# Patient Record
Sex: Male | Born: 2000 | Race: Black or African American | Hispanic: No | Marital: Single | Smoking: Never smoker
Health system: Southern US, Community
[De-identification: ages and names within clinical notes are randomized; demographics above are authoritative.]

## PROBLEM LIST (undated history)

## (undated) HISTORY — PX: INGUINAL HERNIA REPAIR: SHX194

---
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
## 2021-02-09 ENCOUNTER — Telehealth: Payer: Self-pay

## 2021-02-09 NOTE — Telephone Encounter (Signed)
Referral notes received from [facility], Phone #: [phone], Fax #: [phone]   A copy of the notes have been placed in the scheduling box for check-out to pick-up and to enter referral. Original notes placed in file cabinet.

## 2021-03-26 ENCOUNTER — Other Ambulatory Visit: Payer: Self-pay

## 2021-03-26 ENCOUNTER — Encounter: Payer: Self-pay | Admitting: Cardiology

## 2021-03-26 ENCOUNTER — Ambulatory Visit: Payer: BC Managed Care – PPO | Admitting: Cardiology

## 2021-03-26 VITALS — BP 130/80 | HR 79 | Ht 71.0 in | Wt 188.0 lb

## 2021-03-26 DIAGNOSIS — R079 Chest pain, unspecified: Secondary | ICD-10-CM | POA: Diagnosis not present

## 2021-03-26 NOTE — Patient Instructions (Signed)

## 2021-03-26 NOTE — Progress Notes (Signed)
Cardiology Office Note:    Date:  03/26/2021   ID:  Jerry Valenzuela, DOB 08/12/2000, MRN [id number]  PCP:  Santa Genera, MD   [facility] Providers Cardiologist:  None     Referring MD: Juliet Rude*   Chief Complaint  Patient presents with   New Patient (Initial Visit)    Referred by PCP for chest pain -- Patient states he took a break from working out and when he started back he felt this chest pain. The chest pain has resolved. Meds reviewed verbally with patient.    Jerry Valenzuela is a 20 y.o. male who is being seen today for the evaluation of chest pain at the request of Dorma Russell, Katheren Shams, P*.   History of Present Illness:    Jerry Valenzuela is a 20 y.o. male with no significant past medical history who presents due to back and chest pain.  Patient states having back discomfort last month, also had an episode of chest pain.  Went to an urgent care, x-ray was obtained, he is unsure of the results, was advised to follow-up with cardiology.  He works out daily at [employer], [employer], movements, without any symptoms of chest pain or shortness of breath.  Denies smoking, denies any cardiac risk factors or family history of heart disease.  He feels well, states chest discomfort have completely resolved.  Has no concerns at this time.  History reviewed. No pertinent past medical history.  Past Surgical History:  Procedure Laterality Date   INGUINAL HERNIA REPAIR      Current Medications: No outpatient medications have been marked as taking for the 03/26/21 encounter (Office Visit) with Debbe Odea, MD.     Allergies:   Patient has no known allergies.   Social History   Socioeconomic History   Marital status: Single    Spouse name: Not on file   Number of children: Not on file   Years of education: Not on file   Highest education level: Not on file  Occupational History   Not on file  Tobacco Use   Smoking status: Never   Smokeless tobacco: Never   Substance and Sexual Activity   Alcohol use: Never   Drug use: Never   Sexual activity: Not on file  Other Topics Concern   Not on file  Social History Narrative   Not on file   Social Determinants of Health   Financial Resource Strain: Not on file  Food Insecurity: Not on file  Transportation Needs: Not on file  Physical Activity: Not on file  Stress: Not on file  Social Connections: Not on file     Family History: The patient's family history is not on file.  ROS:   Please see the history of present illness.     All other systems reviewed and are negative.  EKGs/Labs/Other Studies Reviewed:    The following studies were reviewed today:   EKG:  EKG is  ordered today.  The ekg ordered today demonstrates normal sinus rhythm, normal ECG.  Recent Labs: No results found for requested labs within last 8760 hours.  Recent Lipid Panel No results found for: CHOL, TRIG, HDL, CHOLHDL, VLDL, LDLCALC, LDLDIRECT   Risk Assessment/Calculations:          Physical Exam:    VS:  BP 130/80 (BP Location: Left Arm, Patient Position: Sitting, Cuff Size: Normal)   Pulse 79   Ht 5\' 11"  (1.803 m)   Wt 188 lb (85.3 kg)  SpO2 98%   BMI 26.22 kg/m     Wt Readings from Last 3 Encounters:  03/26/21 188 lb (85.3 kg) (86 %, Z= 1.10)*  02/12/15 120 lb 8 oz (54.7 kg) (70 %, Z= 0.53)*   * Growth percentiles are based on CDC (Boys, 2-20 Years) data.     GEN:  Well nourished, well developed in no acute distress HEENT: Normal NECK: No JVD; No carotid bruits LYMPHATICS: No lymphadenopathy CARDIAC: RRR, no murmurs, rubs, gallops RESPIRATORY:  Clear to auscultation without rales, wheezing or rhonchi  ABDOMEN: Soft, non-tender, non-distended MUSCULOSKELETAL:  No edema; No deformity  SKIN: Warm and dry NEUROLOGIC:  Alert and oriented x 3 PSYCHIATRIC:  Normal affect   ASSESSMENT:    1. Chest pain, unspecified type    PLAN:    In order of problems listed above:  Patient with  an episode of chest discomfort not consistent with cardiac etiology.  He has no cardiac risk factors, he is young, discomfort is completely resolved.  Works out/exercises daily without any symptoms of chest pain or shortness of breath.  Patient made aware of benign nature, chest pain likely musculoskeletal in origin.  Patient educated on cardiac causes of chest pain.  He was reassured.  Follow-up as needed     Medication Adjustments/Labs and Tests Ordered: Current medicines are reviewed at length with the patient today.  Concerns regarding medicines are outlined above.  Orders Placed This Encounter  Procedures   EKG 12-Lead    No orders of the defined types were placed in this encounter.   Patient Instructions  Medication Instructions:  Your physician recommends that you continue on your current medications as directed. Please refer to the Current Medication list given to you today.  *If you need a refill on your cardiac medications before your next appointment, please call your pharmacy*   Lab Work: None ordered If you have labs (blood work) drawn today and your tests are completely normal, you will receive your results only by: MyChart Message (if you have MyChart) OR A paper copy in the mail If you have any lab test that is abnormal or we need to change your treatment, we will call you to review the results.   Testing/Procedures: None ordered   Follow-Up: At [facility], you and your health needs are our priority.  As part of our continuing mission to provide you with exceptional heart care, we have created designated Provider Care Teams.  These Care Teams include your primary Cardiologist (physician) and Advanced Practice Providers (APPs -  Physician Assistants and Nurse Practitioners) who all work together to provide you with the care you need, when you need it.  We recommend signing up for the patient portal called "MyChart".  Sign up information is provided on this  After Visit Summary.  MyChart is used to connect with patients for Virtual Visits (Telemedicine).  Patients are able to view lab/test results, encounter notes, upcoming appointments, etc.  Non-urgent messages can be sent to your provider as well.   To learn more about what you can do with MyChart, go to [email or web address].    Your next appointment:   Follow up as needed   The format for your next appointment:   In Person  Provider:   Debbe Odea, MD   Other Instructions    Signed, Debbe Odea, MD  03/26/2021 1:05 PM    [facility]

## 2021-04-09 ENCOUNTER — Telehealth: Payer: Self-pay

## 2021-04-09 NOTE — Telephone Encounter (Signed)
NOTES SCANNED TO REFERRAL 

## 2023-01-02 ENCOUNTER — Other Ambulatory Visit: Payer: Self-pay

## 2023-01-02 ENCOUNTER — Encounter (HOSPITAL_COMMUNITY): Payer: Self-pay | Admitting: *Deleted

## 2023-01-02 ENCOUNTER — Ambulatory Visit (HOSPITAL_COMMUNITY)
Admission: EM | Admit: 2023-01-02 | Discharge: 2023-01-02 | Disposition: A | Discharge status: Home / Self Care | Payer: BC Managed Care – PPO | Attending: Emergency Medicine | Admitting: Emergency Medicine

## 2023-01-02 DIAGNOSIS — R059 Cough, unspecified: Secondary | ICD-10-CM | POA: Diagnosis not present

## 2023-01-02 DIAGNOSIS — J069 Acute upper respiratory infection, unspecified: Secondary | ICD-10-CM | POA: Diagnosis not present

## 2023-01-02 DIAGNOSIS — U071 COVID-19: Secondary | ICD-10-CM | POA: Diagnosis not present

## 2023-01-02 NOTE — ED Provider Notes (Signed)
[facility]    CSN: [id number] Arrival date & time: 01/02/23  1015      History   Chief Complaint Chief Complaint  Patient presents with   Fever   Chills    HPI Jerry Valenzuela is a 22 y.o. male.   Patient presents to clinic over concerns of sore throat, fever, chills and feeling unwell since Friday.  His symptoms have improved today.  He did take Aleve for his symptoms, has not taken anything today.  His temperature at home yesterday was 100, this morning he checked it and it was 98.  Denies any recent sick contacts.  Denies chest pain, shortness of breath, wheezing, nausea, vomiting or diarrhea.    The history is provided by the patient and medical records.  Fever Associated symptoms: cough and sore throat   Associated symptoms: no chest pain, no diarrhea, no nausea and no vomiting     History reviewed. No pertinent past medical history.  There are no problems to display for this patient.   Past Surgical History:  Procedure Laterality Date   INGUINAL HERNIA REPAIR         Home Medications    Prior to Admission medications   Not on File    Family History History reviewed. No pertinent family history.  Social History Social History   Tobacco Use   Smoking status: Never   Smokeless tobacco: Never  Substance Use Topics   Alcohol use: Never   Drug use: Never     Allergies   Patient has no known allergies.   Review of Systems Review of Systems  Constitutional:  Positive for fever.  HENT:  Positive for sore throat.   Respiratory:  Positive for cough. Negative for shortness of breath and wheezing.   Cardiovascular:  Negative for chest pain.  Gastrointestinal:  Negative for abdominal pain, diarrhea, nausea and vomiting.     Physical Exam Triage Vital Signs ED Triage Vitals  Enc Vitals Group     BP 01/02/23 1059 127/82     Pulse Rate 01/02/23 1059 98     Resp 01/02/23 1059 18     Temp 01/02/23 1059 99.8 F (37.7 C)     Temp src --       SpO2 01/02/23 1059 95 %     Weight --      Height --      Head Circumference --      Peak Flow --      Pain Score 01/02/23 1055 0     Pain Loc --      Pain Edu? --      Excl. in GC? --    No data found.  Updated Vital Signs BP 127/82   Pulse 98   Temp 99.8 F (37.7 C)   Resp 18   SpO2 95%   Visual Acuity Right Eye Distance:   Left Eye Distance:   Bilateral Distance:    Right Eye Near:   Left Eye Near:    Bilateral Near:     Physical Exam Vitals and nursing note reviewed.  Constitutional:      Appearance: Normal appearance.  HENT:     Head: Normocephalic and atraumatic.     Right Ear: External ear normal.     Left Ear: External ear normal.     Nose: Nose normal.     Mouth/Throat:     Mouth: Mucous membranes are moist.     Pharynx: Posterior oropharyngeal erythema present.  Eyes:  Conjunctiva/sclera: Conjunctivae normal.  Cardiovascular:     Rate and Rhythm: Normal rate and regular rhythm.     Heart sounds: Normal heart sounds. No murmur heard. Pulmonary:     Effort: Pulmonary effort is normal. No respiratory distress.     Breath sounds: Normal breath sounds.  Musculoskeletal:        General: Normal range of motion.  Lymphadenopathy:     Cervical: Cervical adenopathy present.  Skin:    General: Skin is warm and dry.  Neurological:     General: No focal deficit present.     Mental Status: He is alert and oriented to person, place, and time.  Psychiatric:        Mood and Affect: Mood normal.      UC Treatments / Results  Labs (all labs ordered are listed, but only abnormal results are displayed) Labs Reviewed  SARS CORONAVIRUS 2 (TAT 6-24 HRS)    EKG   Radiology No results found.  Procedures Procedures (including critical care time)  Medications Ordered in UC Medications - No data to display  Initial Impression / Assessment and Plan / UC Course  I have reviewed the triage vital signs and the nursing notes.  Pertinent labs &  imaging results that were available during my care of the patient were reviewed by me and considered in my medical decision making (see chart for details).  Vitals and triage reviewed, patient is hemodynamically stable.  Posterior pharynx with erythema, lungs vesicular posteriorly.  Uvula midline, tonsils without edema or exudate, low concern for strep, PTA or other bacterial pathogens. Patient symptoms consistent with a viral URI.  Symptomatic management discussed.  Offered COVID-19 testing, patient accepted.  Staff to contact if positive.  Plan of care, follow-up care and return precautions given, no questions at this time.     Final Clinical Impressions(s) / UC Diagnoses   Final diagnoses:  Viral URI with cough     Discharge Instructions      Your systems are consistent with a viral illness.  We have swabbed you for COVID-19 and we will contact you if positive.  Please continue symptomatic management, you can alternate between 800 mg of ibuprofen and 500 mg of Tylenol every 4-6 hours for body aches, fever and discomfort.  For your sore throat you can sleep with a humidifier, do warm saline gargles, drink tea with honey.  Over-the-counter throat lozenges can help as well.  Ensure you are staying hydrated with at least 64 ounces of water daily.  Please return to clinic or follow-up with your primary care provider if your symptoms do not improve over the next 5 to 7 days or if you develop any new or urgent symptoms.      ED Prescriptions   None    PDMP not reviewed this encounter.   Dajia Gunnels, [place], [place] 01/02/23 1116

## 2023-01-02 NOTE — Discharge Instructions (Addendum)
Your systems are consistent with a viral illness.  We have swabbed you for COVID-19 and we will contact you if positive.  Please continue symptomatic management, you can alternate between 800 mg of ibuprofen and 500 mg of Tylenol every 4-6 hours for body aches, fever and discomfort.  For your sore throat you can sleep with a humidifier, do warm saline gargles, drink tea with honey.  Over-the-counter throat lozenges can help as well.  Ensure you are staying hydrated with at least 64 ounces of water daily.  Please return to clinic or follow-up with your primary care provider if your symptoms do not improve over the next 5 to 7 days or if you develop any new or urgent symptoms.

## 2023-01-02 NOTE — ED Triage Notes (Signed)
Pt reports since Friday he has had a sore throat and fever with chills. Pt reports sx's have improved today.

## 2023-01-03 LAB — SARS CORONAVIRUS 2 (TAT 6-24 HRS): SARS Coronavirus 2: POSITIVE — AB

## 2024-03-09 ENCOUNTER — Other Ambulatory Visit (INDEPENDENT_AMBULATORY_CARE_PROVIDER_SITE_OTHER): Payer: Self-pay

## 2024-03-09 ENCOUNTER — Encounter: Payer: Self-pay | Admitting: Surgical

## 2024-03-09 ENCOUNTER — Ambulatory Visit: Payer: Self-pay | Admitting: Surgical

## 2024-03-09 DIAGNOSIS — M79644 Pain in right finger(s): Secondary | ICD-10-CM | POA: Diagnosis not present

## 2024-03-09 NOTE — Progress Notes (Signed)
 Office Visit Note   Patient: Jerry Valenzuela           Date of Birth: 01-24-2001           MRN: [id number] Visit Date: 03/09/2024 Requested by: Geroge Hubert Norris, PA [address] [place],  [place] [address] PCP: Charmayne Jaegers, MD  Subjective: Chief Complaint  Patient presents with   Right Ring Finger - Injury    HPI: Jerry Valenzuela is a 23 y.o. male who presents to the office reporting right ring finger pain.  Slammed his finger in a car door on 02/27/2024.  He was seen in a walk-in clinic at [facility] on 02/28/2024 and had radiographs demonstrating no fracture.  He was treated for potential open fracture with antibiotics and finger splint placed.  Since then, he has been staying in the finger splint and has returned to work where he works at a [employer].  He has really no significant pain and denies any fevers or chills.  No mechanical symptoms.  No difficulty straightening his finger.  Using splint at all times..                ROS: All systems reviewed are negative as they relate to the chief complaint within the history of present illness.  Patient denies fevers or chills.  Assessment & Plan: Visit Diagnoses:  1. Finger pain, right     Plan: Impression is 23 year old male with right ring finger pain and swelling initially with initial radiographs negative for fracture and today's radiographs again negative for fracture.  He has really no tenderness on exam today.  No evidence or concern for mallet finger.  Plan at this time is discontinue splint to avoid significant stiffness of the finger.  Okay to return to work in full capacity which he is already doing without difficulty.  Okay to follow-up with the office as needed and return precautions were discussed including difficulty extending the DIP joint or increased pain.  He understands he should follow-up with the office and return to the finger splint if he has any difficulty with DIP extension.  Follow-Up Instructions:  Return if symptoms worsen or fail to improve.   Orders:  Orders Placed This Encounter  Procedures   XR Finger Ring Right   No orders of the defined types were placed in this encounter.     Procedures: No procedures performed   Clinical Data: No additional findings.  Objective: Vital Signs: There were no vitals taken for this visit.  Physical Exam:  Constitutional: Patient appears well-developed HEENT:  Head: Normocephalic Eyes:EOM are normal Neck: Normal range of motion Cardiovascular: Normal rate Pulmonary/chest: Effort normal Neurologic: Patient is alert Skin: Skin is warm Psychiatric: Patient has normal mood and affect  Ortho Exam: Ortho exam demonstrates right hand with intact EPL, FPL, finger abduction.  Full flexion and extension of the right ring finger is intact compared with the contralateral extremity.  There is slight swelling just proximal to the base of the nail and there is small subungual hematoma that is resolving toward the distal tip of the nail.  He is able to extend the DIP joint of the right ring finger out against gravity and against resistance with equal strength compared with the contralateral side.  There is no tenderness throughout the distal phalanx or DIP joint or throughout the right ring finger in general.  Specialty Comments:  No specialty comments available.  Imaging: XR Finger Ring Right Result Date: 03/09/2024 PA, oblique, lateral views of  right ring finger reviewed.  No fracture or dislocation.  No significant degenerative changes.    PMFS History: There are no active problems to display for this patient.  History reviewed. No pertinent past medical history.  History reviewed. No pertinent family history.  Past Surgical History:  Procedure Laterality Date   INGUINAL HERNIA REPAIR     Social History   Occupational History   Not on file  Tobacco Use   Smoking status: Never   Smokeless tobacco: Never  Substance and Sexual  Activity   Alcohol use: Never   Drug use: Never   Sexual activity: Not on file

## 2024-04-30 ENCOUNTER — Encounter: Payer: Self-pay | Admitting: Radiology

## 2024-05-29 ENCOUNTER — Ambulatory Visit

## 2024-05-29 ENCOUNTER — Encounter: Payer: Self-pay | Admitting: Podiatry

## 2024-05-29 ENCOUNTER — Ambulatory Visit: Admitting: Podiatry

## 2024-05-29 DIAGNOSIS — S90122A Contusion of left lesser toe(s) without damage to nail, initial encounter: Secondary | ICD-10-CM

## 2024-05-29 DIAGNOSIS — M205X2 Other deformities of toe(s) (acquired), left foot: Secondary | ICD-10-CM | POA: Diagnosis not present

## 2024-05-29 DIAGNOSIS — L603 Nail dystrophy: Secondary | ICD-10-CM | POA: Diagnosis not present

## 2024-05-29 DIAGNOSIS — L84 Corns and callosities: Secondary | ICD-10-CM

## 2024-05-29 MED ORDER — MUPIROCIN 2 % EX OINT: 1.0000 | TOPICAL_OINTMENT | Freq: Two times a day (BID) | CUTANEOUS | 2 refills | Status: AC

## 2024-05-29 NOTE — Patient Instructions (Signed)

## 2024-05-29 NOTE — Progress Notes (Unsigned)
 SABRA

## 2024-07-17 ENCOUNTER — Ambulatory Visit: Payer: Self-pay | Admitting: Podiatry

## 2024-07-17 ENCOUNTER — Encounter: Payer: Self-pay | Admitting: Podiatry

## 2024-07-17 DIAGNOSIS — L989 Disorder of the skin and subcutaneous tissue, unspecified: Secondary | ICD-10-CM | POA: Diagnosis not present

## 2024-07-17 DIAGNOSIS — L603 Nail dystrophy: Secondary | ICD-10-CM

## 2024-07-17 DIAGNOSIS — M205X2 Other deformities of toe(s) (acquired), left foot: Secondary | ICD-10-CM

## 2024-07-17 MED ORDER — CEPHALEXIN 500 MG PO CAPS: 500.0000 mg | ORAL_CAPSULE | Freq: Three times a day (TID) | ORAL | 0 refills | Status: AC

## 2024-07-17 NOTE — Progress Notes (Signed)
 "   Subjective:  Patient ID: Jerry Valenzuela, male    DOB: [birth year],  MRN: [id number]  Chief Complaint  Patient presents with   Toe Pain    Patient presents today for follow-up of acquired adductovarus rotation of the left toe. Patient reports soaking the foot in Epsom salt and applying bacitracin with no improvement in toe pain. Patient denies any change in symptoms and relates that pain level remains unchanged since the last visit.    History of Present Illness Jerry Valenzuela is a 24 year old male who presents with left fifth toe pain and possible nail issue.  He states that he continues soaks the foot and using ointment.  Continues to be tender.  He does not report any new injuries or other concerns today.  No drainage or pus.  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 He is also an avid snowboarder. He thinks the wet conditions at work may have contributed. He has not changed footwear recently.  He has no medication allergies and no major medical problems. He has not been soaking the toe or using other home treatments.      Objective:  There were no vitals filed for this visit.  Physical Exam General: AAO x3, NAD  Dermatological: Thick hyperkeratotic lesion is noted just  proximal and lateral to the fifth digit toenail.  Mild incurvation of the nail border nails hypertrophic, dystrophic as well in the corner.  There is palpation is on the area and the skin lesion just on the proximal nail fold on the lateral portion of the toenail.  Localized edema present but there is no drainage or pus.  Vascular: Dorsalis Pedis artery and Posterior Tibial artery pedal pulses are 2/4 bilateral with immedate capillary fill time. There is no pain with calf compression, swelling, warmth, erythema.   Neruologic: Grossly intact via light touch bilateral.   Musculoskeletal: There is a varus present to the fifth toe.  Tenderness to palpation on the area of the thick  hyperkeratotic tissue.    Assessment:   1. Acquired adductovarus rotation of toe, left   2. Nail dystrophy   3. Corns and callosities      Plan:  Patient was evaluated and treated and all questions answered.  Assessment and Plan Assessment & Plan Left fifth toe callus with nail changes and inflammation -Given ongoing nature of his symptoms I discussed with them partial nail avulsion with excision of the skin lesion as well as biopsy of the lesion.  Understanding risks and complications he wishes to proceed.  Discussed with him long-term and I considered further surgical intervention. -At this time, the patient is agrees to proceed with biopsy, partial nail removal with chemical matricectomy to the symptomatic portion of the nail. Risks and complications were discussed with the patient for which they understand and written consent was obtained. Under sterile conditions a total of 3 mL of a mixture of 2% lidocaine plain and 0.5% Marcaine plain was infiltrated in a hallux block fashion. Once anesthetized, the skin was prepped in sterile fashion. A tourniquet was then applied. Next the lateral aspect of hallux nail border was then sharply excised making sure to remove the entire offending nail border.  I send was able to remove the proximal skin lesion that was just adjacent to the toenail in total.  Unable to excise the skin lesion with a tissue nipper.  This was sent to pathology.  Once the nails were ensured to  be removed area was debrided and the underlying skin was intact. There is no purulence identified in the procedure. Next phenol was then applied under standard conditions and copiously irrigated.  Silvadene was applied. A dry sterile dressing was applied. After application of the dressing the tourniquet was removed and there is found to be an immediate capillary refill time to the digit. The patient tolerated the procedure well any complications. Post procedure instructions were discussed  the patient for which he verbally understood. Discussed signs/symptoms of infection and directed to call the office immediately should any occur or go directly to the emergency room. In the meantime, encouraged to call the office with any questions, concerns, changes symptoms. -Keflex   Return in about 1 week (around 07/24/2024) for nail check .  Donnice JONELLE Fees DPM "

## 2024-07-17 NOTE — Patient Instructions (Signed)

## 2024-07-23 ENCOUNTER — Ambulatory Visit: Admitting: Podiatry

## 2024-07-26 ENCOUNTER — Ambulatory Visit: Admitting: Podiatry

## 2024-07-31 ENCOUNTER — Ambulatory Visit: Payer: Self-pay | Admitting: Podiatry
